# Patient Record
Sex: Male | Born: 1970 | Race: White | Hispanic: No | Marital: Married | Smoking: Never smoker
Health system: Southern US, Community
[De-identification: ages and names within clinical notes are randomized; demographics above are authoritative.]

## PROBLEM LIST (undated history)

## (undated) DIAGNOSIS — N289 Disorder of kidney and ureter, unspecified: Secondary | ICD-10-CM

## (undated) HISTORY — PX: OTHER SURGICAL HISTORY: SHX169

---

## 2007-01-18 ENCOUNTER — Ambulatory Visit: Payer: Self-pay | Admitting: Gastroenterology

## 2007-01-18 LAB — CONVERTED CEMR LAB
ALT: 32 units/L (ref 0–53)
Alkaline Phosphatase: 102 units/L (ref 39–117)
BUN: 13 mg/dL (ref 6–23)
Basophils Relative: 0.1 % (ref 0.0–1.0)
Calcium: 10 mg/dL (ref 8.4–10.5)
Chloride: 106 meq/L (ref 96–112)
Eosinophils Relative: 1.7 % (ref 0.0–5.0)
GFR calc Af Amer: 108 mL/min
GFR calc non Af Amer: 89 mL/min
HCT: 44.6 % (ref 39.0–52.0)
Lymphocytes Relative: 14.4 % (ref 12.0–46.0)
Neutrophils Relative %: 74.9 % (ref 43.0–77.0)
Platelets: 267 10*3/uL (ref 150–400)
Potassium: 4 meq/L (ref 3.5–5.1)
RBC: 5.06 M/uL (ref 4.22–5.81)
TSH: 0.75 microintl units/mL (ref 0.35–5.50)
Total Protein: 7.1 g/dL (ref 6.0–8.3)
WBC: 10.4 10*3/uL (ref 4.5–10.5)

## 2007-01-31 ENCOUNTER — Ambulatory Visit: Payer: Self-pay | Admitting: Gastroenterology

## 2007-06-07 DIAGNOSIS — I10 Essential (primary) hypertension: Secondary | ICD-10-CM | POA: Insufficient documentation

## 2007-06-07 DIAGNOSIS — M545 Low back pain: Secondary | ICD-10-CM

## 2007-06-07 DIAGNOSIS — K644 Residual hemorrhoidal skin tags: Secondary | ICD-10-CM | POA: Insufficient documentation

## 2007-06-07 DIAGNOSIS — J301 Allergic rhinitis due to pollen: Secondary | ICD-10-CM

## 2007-06-07 DIAGNOSIS — N059 Unspecified nephritic syndrome with unspecified morphologic changes: Secondary | ICD-10-CM | POA: Insufficient documentation

## 2007-06-07 DIAGNOSIS — K5900 Constipation, unspecified: Secondary | ICD-10-CM | POA: Insufficient documentation

## 2010-08-17 NOTE — Assessment & Plan Note (Signed)
[facility]                         GASTROENTEROLOGY OFFICE NOTE   DIYARI, CHERNE                          MRN:          [id number]  DATE:01/18/2007                            DOB:          05-16-1970    CHIEF COMPLAINT:  A 40 year old white male, self-referred for abdominal  bloating, constipation and early satiety.   HISTORY OF PRESENT ILLNESS:  Mr. Sebring relates a history of hemorrhoids  in the past.  Over the past 3-4 months, he has had worsening problems  with constipation with small stools, incomplete fecal evacuation and  abdominal bloating.  He also has noted early satiety, but his weight has  been stable.  He has recently noticed some hemorrhoidal swelling and  discomfort.  He notes no rectal bleeding.  He has a maternal grandfather  with colon cancer.  No other family members with colon cancer, colon  polyps or irritable bowel disease.   PAST MEDICAL HISTORY:  1. Hypertension.  2. IgA nephropathy.  3. Seasonal allergies.  4. Low back pain.   CURRENT MEDICATIONS:  Listed on the chart, updated and reviewed.   ALLERGIES:  None known.   SOCIAL HISTORY/REVIEW OF SYSTEMS:  Per the handwritten form.   PHYSICAL EXAMINATION:  GENERAL:  An overweight white male in no acute  distress.  VITAL SIGNS:  Height 5 feet 11 inches, weight 251 pounds, blood pressure  138/90, pulse 88 and regular .  HEENT:  Anicteric sclerae, oropharynx is clear.  CHEST:  Clear to auscultation bilaterally.  CARDIAC:  Regular rate and rhythm without murmurs.  ABDOMEN:  Soft, nontender, nondistended, normoactive bowel sounds.  No  palpable organomegaly, masses or hernias.  RECTAL:  a moderate-sized, thrombosed external hemorrhoid that is  tender.  No internal lesions.  Hemoccult negative brown stool is noted  in the vault.  EXTREMITIES:  Without cyanosis, clubbing or edema.  NEUROLOGICAL:  Alert and oriented x3.  Grossly nonfocal.   ASSESSMENT/PLAN:  Change in  bowel habits with worsening constipation,  abdominal bloating and early satiety.  In addition, he has a thrombosed  external hemorrhoid.  He is to substantially increase his fluid and  fiber intake, begin a daily fiber supplement, begin MiraLax twice a day,  begin Analpram 2.5% HC cream b.i.d. and standard rectal care  instructions.  Obtain  a CBC, C-met and TSH today.  Risks, benefits and alternatives to  colonoscopy with possible  biopsy, possible polypectomy discussed with the patient.  He consents to  proceed.  This will be scheduled electively.     Venita Lick. Russella Dar, MD, [facility] ([facility])  Electronically Signed    MTS/MedQ  DD: 01/22/2007  DT: 01/22/2007  Job #: [id number]

## 2011-11-23 ENCOUNTER — Encounter: Payer: Self-pay | Admitting: Gastroenterology

## 2011-11-28 ENCOUNTER — Encounter: Payer: Self-pay | Admitting: Gastroenterology

## 2012-01-23 ENCOUNTER — Encounter: Payer: Self-pay | Admitting: Gastroenterology

## 2012-07-25 ENCOUNTER — Encounter: Payer: Self-pay | Admitting: Gastroenterology

## 2012-07-30 ENCOUNTER — Telehealth: Payer: Self-pay | Admitting: Gastroenterology

## 2012-07-30 NOTE — Telephone Encounter (Signed)
Advised pt I would notate in his chart

## 2015-06-22 ENCOUNTER — Encounter (HOSPITAL_COMMUNITY): Payer: Self-pay | Admitting: Emergency Medicine

## 2015-06-22 ENCOUNTER — Emergency Department (HOSPITAL_COMMUNITY)
Admission: EM | Admit: 2015-06-22 | Discharge: 2015-06-22 | Disposition: A | Discharge status: Home / Self Care | Payer: Medicaid Other | Attending: Emergency Medicine | Admitting: Emergency Medicine

## 2015-06-22 DIAGNOSIS — Z87448 Personal history of other diseases of urinary system: Secondary | ICD-10-CM | POA: Insufficient documentation

## 2015-06-22 DIAGNOSIS — Y9289 Other specified places as the place of occurrence of the external cause: Secondary | ICD-10-CM | POA: Insufficient documentation

## 2015-06-22 DIAGNOSIS — Y998 Other external cause status: Secondary | ICD-10-CM | POA: Diagnosis not present

## 2015-06-22 DIAGNOSIS — Z880 Allergy status to penicillin: Secondary | ICD-10-CM | POA: Insufficient documentation

## 2015-06-22 DIAGNOSIS — Z23 Encounter for immunization: Secondary | ICD-10-CM | POA: Insufficient documentation

## 2015-06-22 DIAGNOSIS — Y9389 Activity, other specified: Secondary | ICD-10-CM | POA: Insufficient documentation

## 2015-06-22 DIAGNOSIS — S61411A Laceration without foreign body of right hand, initial encounter: Secondary | ICD-10-CM | POA: Diagnosis not present

## 2015-06-22 DIAGNOSIS — W268XXA Contact with other sharp object(s), not elsewhere classified, initial encounter: Secondary | ICD-10-CM | POA: Diagnosis not present

## 2015-06-22 DIAGNOSIS — S6991XA Unspecified injury of right wrist, hand and finger(s), initial encounter: Secondary | ICD-10-CM | POA: Diagnosis present

## 2015-06-22 HISTORY — DX: Disorder of kidney and ureter, unspecified: N28.9

## 2015-06-22 MED ORDER — TETANUS-DIPHTH-ACELL PERTUSSIS 5-2.5-18.5 LF-MCG/0.5 IM SUSP
0.5000 mL | Freq: Once | INTRAMUSCULAR | Status: AC
  Administered 2015-06-22: 0.5 mL via INTRAMUSCULAR
  Filled 2015-06-22: qty 0.5

## 2015-06-22 MED ORDER — CEPHALEXIN 250 MG PO CAPS
500.0000 mg | ORAL_CAPSULE | Freq: Once | ORAL | Status: AC
  Administered 2015-06-22: 500 mg via ORAL
  Filled 2015-06-22: qty 2

## 2015-06-22 MED ORDER — LIDOCAINE HCL (PF) 1 % IJ SOLN
5.0000 mL | Freq: Once | INTRAMUSCULAR | Status: AC
  Administered 2015-06-22: 5 mL
  Filled 2015-06-22: qty 5

## 2015-06-22 MED ORDER — CEPHALEXIN 500 MG PO CAPS: 500.0000 mg | ORAL_CAPSULE | Freq: Three times a day (TID) | ORAL | Status: AC

## 2015-06-22 NOTE — Discharge Instructions (Signed)
Take tylenol or Advil as needed for pain. Follow up in 7 to 10 days for suture removal or sooner for problems.

## 2015-06-22 NOTE — ED Notes (Signed)
Pt reports he cut palm of R hand with a nail. Wound still bleeding- dressing in place.

## 2015-06-22 NOTE — ED Provider Notes (Signed)
CSN: [id number]     Arrival date & time 06/22/15  1957 History  By signing my name below, I, Gonzella Lex, attest that this documentation has been prepared under the direction and in the presence of Kerrie Buffalo, NP. Electronically Signed: Gonzella Lex, Scribe. 06/22/2015. 9:00 PM.   Chief Complaint  Patient presents with  . Extremity Laceration   Patient is a 45 y.o. male presenting with skin laceration. The history is provided by the patient.  Laceration Location:  Hand Hand laceration location:  R palm Quality: straight   Bleeding: controlled with pressure   Laceration mechanism:  Nail Pain details:    Quality:  Unable to specify   Severity:  Moderate   Timing:  Constant   Progression:  Unchanged Foreign body present:  No foreign bodies Relieved by:  Nothing Worsened by:  Nothing tried Ineffective treatments:  None tried Tetanus status:  Out of date  HPI Comments: Ronnie Banks is a 45 y.o. male who presents to the Emergency Department complaining of a laceration to his right palm which he acquired by cutting his hand on a nail earlier today while rolling a tree that he had just cut down. Pt has applied pressure with a rag which controls the bleeding until removed. Pt's last tetanus was over seven years ago.   Past Medical History  Diagnosis Date  . Renal disorder    Past Surgical History  Procedure Laterality Date  . Kidney biposy     No family history on file. Social History  Substance Use Topics  . Smoking status: Never Smoker   . Smokeless tobacco: None  . Alcohol Use: No    Review of Systems  Skin: Positive for wound ( right palm).  All other systems reviewed and are negative.  Allergies  Penicillins  Home Medications   Prior to Admission medications   Medication Sig Start Date End Date Taking? Authorizing Provider  cephALEXin (KEFLEX) 500 MG capsule Take 1 capsule (500 mg total) by mouth 3 (three) times daily. 06/22/15   Hope Orlene Och, NP    BP 120/79 mmHg  Pulse 84  Temp(Src) 98.2 F (36.8 C) (Oral)  Resp 18  Ht  (1.803 m)  Wt 116.83 kg  BMI 35.94 kg/m2  SpO2 97% Physical Exam  Constitutional: He is oriented to person, place, and time. He appears well-developed and well-nourished. No distress.  HENT:  Head: Normocephalic and atraumatic.  Eyes: Conjunctivae are normal.  Neck: Neck supple.  Cardiovascular: Normal rate.   Pulmonary/Chest: Effort normal.  Abdominal: He exhibits no distension.  Musculoskeletal:       Right hand: He exhibits tenderness and laceration. He exhibits normal range of motion, no bony tenderness, normal capillary refill and no swelling. Normal sensation noted. Normal strength noted. He exhibits no thumb/finger opposition.  Laceration of palm of right hand  Neurological: He is alert and oriented to person, place, and time.  Skin: Skin is warm and dry.  Psychiatric: He has a normal mood and affect. His behavior is normal.  Nursing note and vitals reviewed.  ED Course  Procedures  DIAGNOSTIC STUDIES:    Oxygen Saturation is 95% on RA, adequate by my interpretation.   COORDINATION OF CARE:  8:59 PM Will order tetanus and will repair laceration. Will prescribe pt antibiotics. Discussed treatment plan with pt at bedside and pt agreed to plan.   LACERATION REPAIR PROCEDURE NOTE The patient's identification was confirmed and consent was obtained. This procedure was performed by [facility]  Damian LeavellNeese, NP at 8:59 PM. Site: Right palm   Sterile procedures observed Anesthetic used (type and amt): 5 mL of Lidocaine 1% plain Suture type/size:5-0 Prolene  Length:3 cm # of Sutures: 6 Technique:Simple Complexity: Simple Antibx ointment applied Tetanus ordered  Site anesthetized, irrigated with NS, explored without evidence of foreign body, wound well approximated, site covered with dry, sterile dressing.  Patient tolerated procedure well without complications. Instructions for care discussed  verbally and patient provided with additional written instructions for homecare and f/u.  MDM  45 y.o. male with laceration to the right hand that was caused by a nail stable for d/c without focal neuro deficits. He will f/u in one week for suture removal or sooner for problems. Will start Keflex.   Final diagnoses:  Laceration of hand, right, initial encounter   I personally performed the services described in this documentation, which was scribed in my presence. The recorded information has been reviewed and is accurate.    [place] CityHope M Neese, NP 06/25/15 1150  Lavera Guiseana Duo Liu, MD 06/27/15 2136

## 2015-06-22 NOTE — ED Notes (Signed)
Last tetanus shot over 7 years ago.

## 2017-01-14 IMAGING — CR DG CERVICAL SPINE FLEX&EXT ONLY
3 series · 3 of 3 positions shown · non-contrast
Comparison: None.

CLINICAL DATA: Cervical radiculopathy.  Postop neck surgery

EXAM:
CERVICAL SPINE - FLEXION AND EXTENSION VIEWS ONLY

[w cervical spine lat]
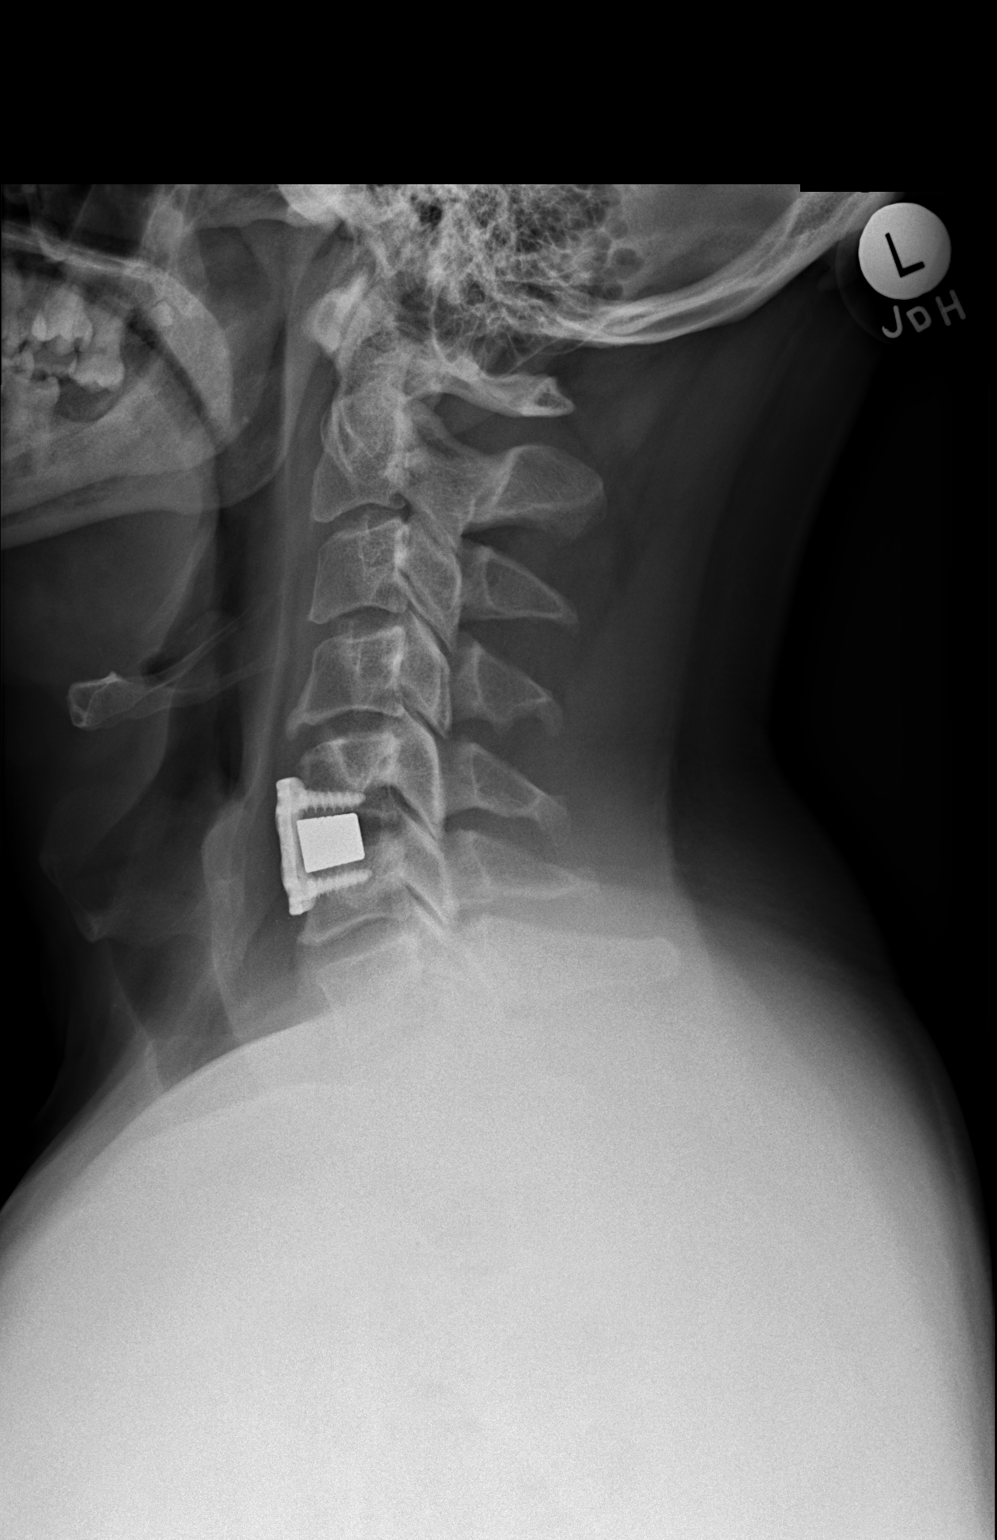

[w cervical spine flexion]
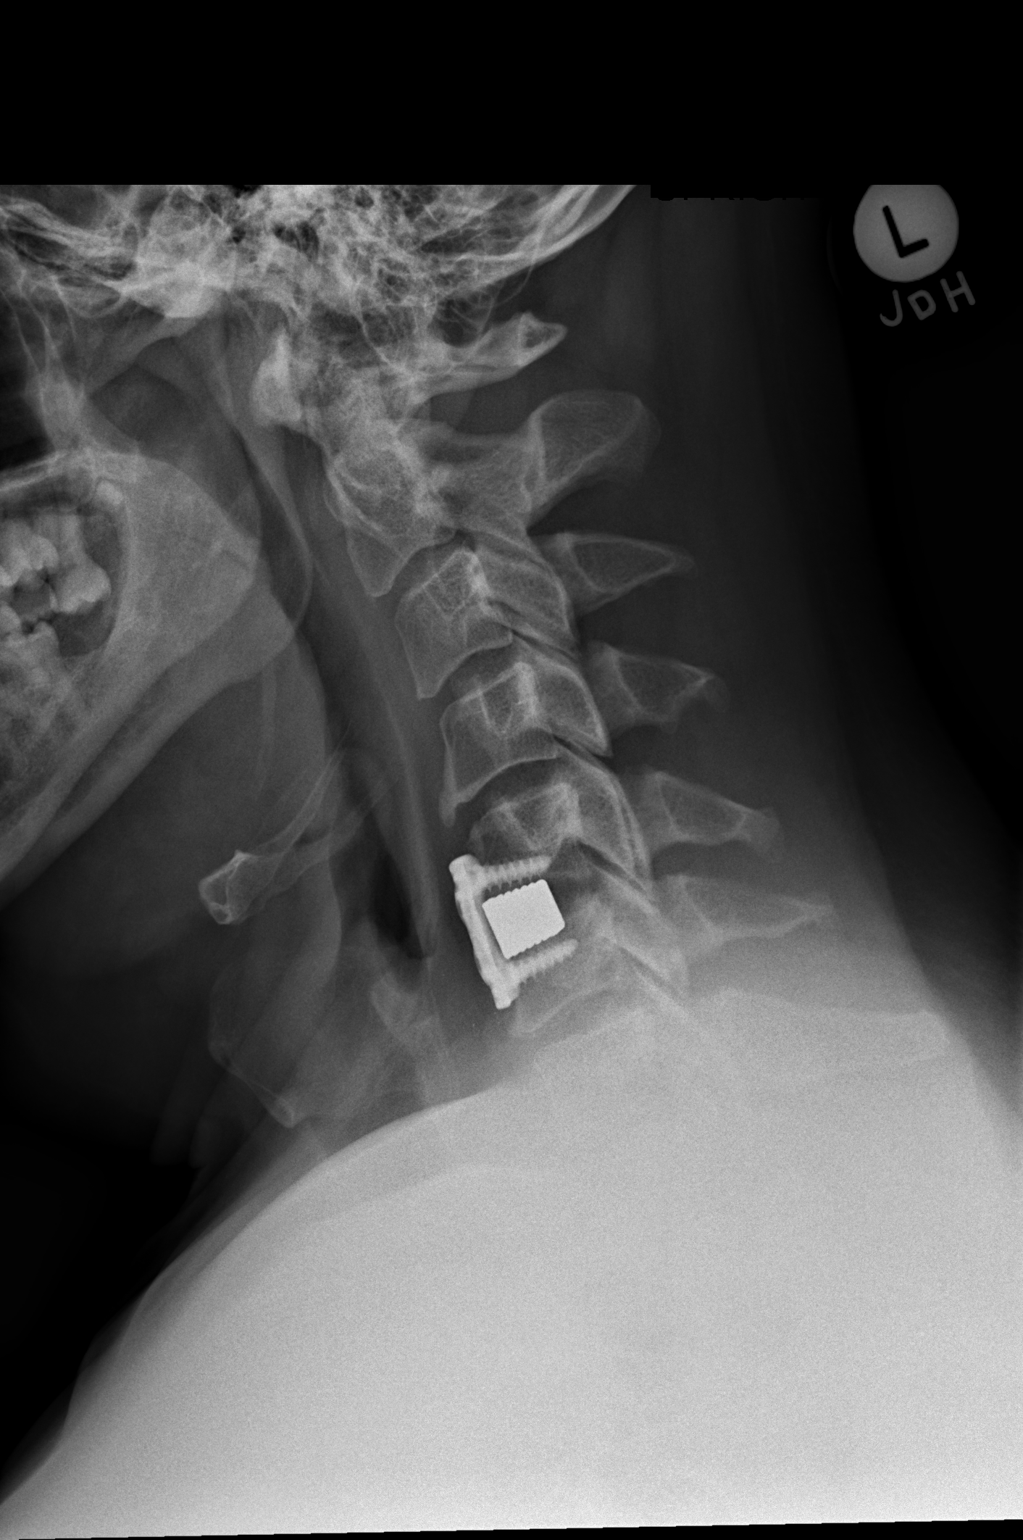

[w cervical spine extension]
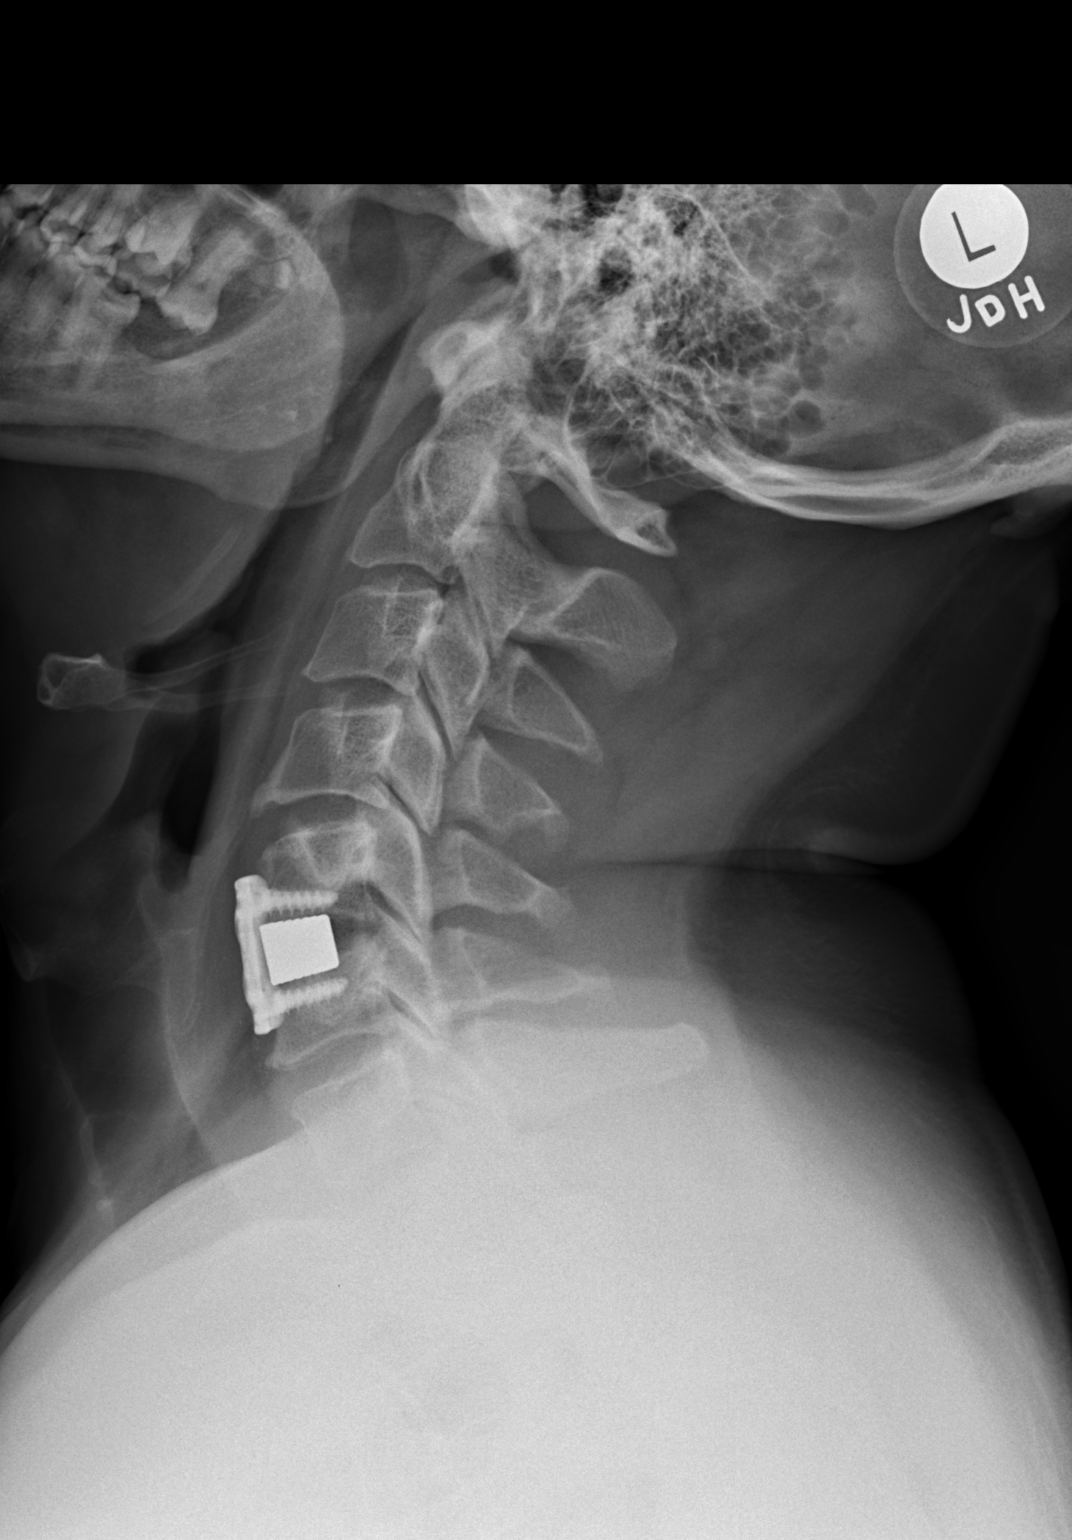

[3 of 3 positions shown; findings below may reference images not displayed]

FINDINGS: ACDF C5-6. Anterior plate and screws in good position. Metal
interbody spacer at C5-6 in good position.

Normal alignment. No fracture. Mild disc degeneration and spurring
at C4-5.

Flexion-extension views reveal normal alignment. No prevertebral
soft tissue swelling
IMPRESSION: ACDF C5-6 in satisfactory position. Mild disc degeneration at C4-5.
No acute abnormality. No instability on flexion or extension.
